# Patient Record
Sex: Female | Born: 2000 | Race: White | Hispanic: No | Marital: Single | Smoking: Never smoker
Health system: Southern US, Community
[De-identification: ages and names within clinical notes are randomized; demographics above are authoritative.]

## PROBLEM LIST (undated history)

## (undated) DIAGNOSIS — F980 Enuresis not due to a substance or known physiological condition: Secondary | ICD-10-CM

## (undated) DIAGNOSIS — A749 Chlamydial infection, unspecified: Secondary | ICD-10-CM

## (undated) DIAGNOSIS — L309 Dermatitis, unspecified: Secondary | ICD-10-CM

## (undated) HISTORY — DX: Chlamydial infection, unspecified: A74.9

## (undated) HISTORY — DX: Dermatitis, unspecified: L30.9

## (undated) HISTORY — PX: OTHER SURGICAL HISTORY: SHX169

## (undated) HISTORY — DX: Enuresis not due to a substance or known physiological condition: F98.0

---
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
## 2019-01-02 DIAGNOSIS — Z20828 Contact with and (suspected) exposure to other viral communicable diseases: Secondary | ICD-10-CM | POA: Diagnosis not present

## 2019-01-03 ENCOUNTER — Other Ambulatory Visit: Payer: Self-pay | Admitting: Primary Care

## 2019-01-22 ENCOUNTER — Other Ambulatory Visit: Payer: Self-pay

## 2019-01-22 ENCOUNTER — Encounter: Payer: Self-pay | Admitting: Primary Care

## 2019-01-22 ENCOUNTER — Ambulatory Visit (INDEPENDENT_AMBULATORY_CARE_PROVIDER_SITE_OTHER): Payer: BC Managed Care – PPO | Admitting: Primary Care

## 2019-01-22 VITALS — BP 120/76 | HR 63 | Temp 98.3°F | Ht 65.5 in | Wt 174.0 lb

## 2019-01-22 DIAGNOSIS — N926 Irregular menstruation, unspecified: Secondary | ICD-10-CM | POA: Diagnosis not present

## 2019-01-22 DIAGNOSIS — F411 Generalized anxiety disorder: Secondary | ICD-10-CM | POA: Diagnosis not present

## 2019-01-22 DIAGNOSIS — Z0000 Encounter for general adult medical examination without abnormal findings: Secondary | ICD-10-CM | POA: Diagnosis not present

## 2019-01-22 NOTE — Patient Instructions (Signed)
Start exercising. You should be getting 150 minutes of moderate intensity exercise weekly.  It's important to improve your diet by reducing consumption of fast food, fried food, processed snack foods, sugary drinks. Increase consumption of fresh vegetables and fruits, whole grains, water.  Ensure you are drinking 64 ounces of water daily.  It was a pleasure to see you today!   Preventive Care 18-39 Years, Female Preventive care refers to lifestyle choices and visits with your health care provider that can promote health and wellness. What does preventive care include?   A yearly physical exam. This is also called an annual well check.  Dental exams once or twice a year.  Routine eye exams. Ask your health care provider how often you should have your eyes checked.  Personal lifestyle choices, including: ? Daily care of your teeth and gums. ? Regular physical activity. ? Eating a healthy diet. ? Avoiding tobacco and drug use. ? Limiting alcohol use. ? Practicing safe sex. ? Taking vitamin and mineral supplements as recommended by your health care provider. What happens during an annual well check? The services and screenings done by your health care provider during your annual well check will depend on your age, overall health, lifestyle risk factors, and family history of disease. Counseling Your health care provider may ask you questions about your:  Alcohol use.  Tobacco use.  Drug use.  Emotional well-being.  Home and relationship well-being.  Sexual activity.  Eating habits.  Work and work environment.  Method of birth control.  Menstrual cycle.  Pregnancy history. Screening You may have the following tests or measurements:  Height, weight, and BMI.  Diabetes screening. This is done by checking your blood sugar (glucose) after you have not eaten for a while (fasting).  Blood pressure.  Lipid and cholesterol levels. These may be checked every 5 years  starting at age 20.  Skin check.  Hepatitis C blood test.  Hepatitis B blood test.  Sexually transmitted disease (STD) testing.  BRCA-related cancer screening. This may be done if you have a family history of breast, ovarian, tubal, or peritoneal cancers.  Pelvic exam and Pap test. This may be done every 3 years starting at age 21. Starting at age 30, this may be done every 5 years if you have a Pap test in combination with an HPV test. Discuss your test results, treatment options, and if necessary, the need for more tests with your health care provider. Vaccines Your health care provider may recommend certain vaccines, such as:  Influenza vaccine. This is recommended every year.  Tetanus, diphtheria, and acellular pertussis (Tdap, Td) vaccine. You may need a Td booster every 10 years.  Varicella vaccine. You may need this if you have not been vaccinated.  HPV vaccine. If you are 26 or younger, you may need three doses over 6 months.  Measles, mumps, and rubella (MMR) vaccine. You may need at least one dose of MMR. You may also need a second dose.  Pneumococcal 13-valent conjugate (PCV13) vaccine. You may need this if you have certain conditions and were not previously vaccinated.  Pneumococcal polysaccharide (PPSV23) vaccine. You may need one or two doses if you smoke cigarettes or if you have certain conditions.  Meningococcal vaccine. One dose is recommended if you are age 19-21 years and a first-year college student living in a residence hall, or if you have one of several medical conditions. You may also need additional booster doses.  Hepatitis A vaccine. You may need   this if you have certain conditions or if you travel or work in places where you may be exposed to hepatitis A.  Hepatitis B vaccine. You may need this if you have certain conditions or if you travel or work in places where you may be exposed to hepatitis B.  Haemophilus influenzae type b (Hib) vaccine. You  may need this if you have certain risk factors. Talk to your health care provider about which screenings and vaccines you need and how often you need them. This information is not intended to replace advice given to you by your health care provider. Make sure you discuss any questions you have with your health care provider. Document Released: 09/17/2001 Document Revised: 03/04/2017 Document Reviewed: 05/23/2015 Elsevier Interactive Patient Education  2019 Elsevier Inc.  

## 2019-01-22 NOTE — Assessment & Plan Note (Signed)
Menstrual cycles improved and are regular on OCP's. Continue same.

## 2019-01-22 NOTE — Progress Notes (Signed)
Subjective:    Patient ID: Briana Flores, female    DOB: [birth year], 18 y.o.   MRN: [id number]  HPI  Briana Flores is a 18 year old female who presents today for complete physical. She is also needing a form completed for school next year.  Immunizations: -Tetanus: Completed in 2012 -Influenza: Due this season  -HPV: Completed series  Diet: She endorses a healthy diet more recently. She is eating vegetables, chicken salad, pickles, lean protein. She is drinking water, green tea, coffee. Little soda. Exercise:  She is not exercising, but is active at work.   Eye exam: Completed several years ago. Dental exam: Completed in 2019   Review of Systems  Constitutional: Negative for unexpected weight change.  HENT: Negative for rhinorrhea.   Eyes: Negative for visual disturbance.  Respiratory: Negative for cough and shortness of breath.   Cardiovascular: Negative for chest pain.  Gastrointestinal: Negative for constipation and diarrhea.  Genitourinary: Negative for difficulty urinating and menstrual problem.  Musculoskeletal: Negative for arthralgias and myalgias.  Skin: Negative for rash.  Allergic/Immunologic: Negative for environmental allergies.  Neurological: Negative for dizziness, numbness and headaches.  Psychiatric/Behavioral:       Feels well managed on fluoxetine       Past Medical History:  Diagnosis Date  . Chlamydia infection    01/19/2018 and 11/11/2018  . Eczema   . Nonorganic enuresis   . Vesicoureteral reflux      Social History   Socioeconomic History  . Marital status: Single    Spouse name: Not on file  . Number of children: Not on file  . Years of education: Not on file  . Highest education level: Not on file  Occupational History  . Not on file  Social Needs  . Financial resource strain: Not on file  . Food insecurity    Worry: Not on file    Inability: Not on file  . Transportation needs    Medical: Not on file    Non-medical: Not on file   Tobacco Use  . Smoking status: Never Smoker  . Smokeless tobacco: Never Used  Substance and Sexual Activity  . Alcohol use: No  . Drug use: No  . Sexual activity: Not on file  Lifestyle  . Physical activity    Days per week: Not on file    Minutes per session: Not on file  . Stress: Not on file  Relationships  . Social Herbalist on phone: Not on file    Gets together: Not on file    Attends religious service: Not on file    Active member of club or organization: Not on file    Attends meetings of clubs or organizations: Not on file    Relationship status: Not on file  . Intimate partner violence    Fear of current or ex partner: Not on file    Emotionally abused: Not on file    Physically abused: Not on file    Forced sexual activity: Not on file  Other Topics Concern  . Not on file  Social History Narrative   Parents marriedD   ad is retired Geneticist, molecular but doing contract work   Mother is Optometrist on disability   1 brother, 1 sister    Past Surgical History:  Procedure Laterality Date  . ureteral reflux surgery  summer 09    Family History  Problem Relation Age of Onset  . Allergies Father   . Hemochromatosis  Mother   . Kidney cancer Paternal Grandmother     No Known Allergies  Current Outpatient Medications on File Prior to Visit  Medication Sig Dispense Refill  . FLUoxetine (PROZAC) 40 MG capsule TAKE 1 CAPSULE BY MOUTH EVERY DAY 90 capsule 1  . SPRINTEC 28 0.25-35 MG-MCG tablet TAKE 1 TABLET BY MOUTH EVERY DAY 84 tablet 0   No current facility-administered medications on file prior to visit.     BP 120/76   Pulse 63   Temp 98.3 F (36.8 C) (Tympanic)   Ht 5' 5.5" (1.664 m)   Wt 174 lb (78.9 kg)   LMP 01/04/2019   SpO2 98%   BMI 28.51 kg/m    Objective:   Physical Exam  Constitutional: She is oriented to person, place, and time. She appears well-nourished.  HENT:  Mouth/Throat: No oropharyngeal exudate.  Eyes: Pupils are  equal, round, and reactive to light. EOM are normal.  Neck: Neck supple. No thyromegaly present.  Cardiovascular: Normal rate and regular rhythm.  Respiratory: Effort normal and breath sounds normal.  GI: Soft. Bowel sounds are normal. There is no abdominal tenderness.  Musculoskeletal: Normal range of motion.  Neurological: She is alert and oriented to person, place, and time.  Skin: Skin is warm and dry.  Psychiatric: She has a normal mood and affect.           Assessment & Plan:

## 2019-01-22 NOTE — Assessment & Plan Note (Signed)
Doing well on fluoxetine 40 mg daily. Denies SI/HI.  Continue same.

## 2019-01-22 NOTE — Assessment & Plan Note (Signed)
Immunizations UTD. Encouraged a healthy diet and regular exercise.  Exam unremarkable. No labs needed. Follow up in 1 year for CPE.

## 2019-02-26 ENCOUNTER — Other Ambulatory Visit: Payer: Self-pay | Admitting: Primary Care

## 2019-02-26 DIAGNOSIS — F411 Generalized anxiety disorder: Secondary | ICD-10-CM

## 2019-03-27 ENCOUNTER — Other Ambulatory Visit: Payer: Self-pay | Admitting: Primary Care

## 2019-04-26 DIAGNOSIS — Z113 Encounter for screening for infections with a predominantly sexual mode of transmission: Secondary | ICD-10-CM | POA: Diagnosis not present

## 2019-06-16 DIAGNOSIS — Z113 Encounter for screening for infections with a predominantly sexual mode of transmission: Secondary | ICD-10-CM | POA: Diagnosis not present

## 2019-06-16 DIAGNOSIS — N760 Acute vaginitis: Secondary | ICD-10-CM | POA: Diagnosis not present

## 2019-07-12 DIAGNOSIS — Z20828 Contact with and (suspected) exposure to other viral communicable diseases: Secondary | ICD-10-CM | POA: Diagnosis not present

## 2019-07-22 DIAGNOSIS — Z20828 Contact with and (suspected) exposure to other viral communicable diseases: Secondary | ICD-10-CM | POA: Diagnosis not present

## 2019-08-03 DIAGNOSIS — Z20828 Contact with and (suspected) exposure to other viral communicable diseases: Secondary | ICD-10-CM | POA: Diagnosis not present

## 2019-08-04 DIAGNOSIS — Z20828 Contact with and (suspected) exposure to other viral communicable diseases: Secondary | ICD-10-CM | POA: Diagnosis not present

## 2019-08-04 DIAGNOSIS — Z2089 Contact with and (suspected) exposure to other communicable diseases: Secondary | ICD-10-CM | POA: Diagnosis not present

## 2019-10-18 DIAGNOSIS — Z23 Encounter for immunization: Secondary | ICD-10-CM | POA: Diagnosis not present

## 2019-11-15 DIAGNOSIS — Z23 Encounter for immunization: Secondary | ICD-10-CM | POA: Diagnosis not present

## 2019-11-22 DIAGNOSIS — F411 Generalized anxiety disorder: Secondary | ICD-10-CM

## 2019-11-22 MED ORDER — FLUOXETINE HCL 40 MG PO CAPS: ORAL_CAPSULE | ORAL | 0 refills | Status: DC

## 2019-12-08 ENCOUNTER — Other Ambulatory Visit: Payer: Self-pay | Admitting: Primary Care

## 2020-01-25 ENCOUNTER — Other Ambulatory Visit: Payer: Self-pay

## 2020-01-25 ENCOUNTER — Ambulatory Visit (INDEPENDENT_AMBULATORY_CARE_PROVIDER_SITE_OTHER): Payer: BC Managed Care – PPO | Admitting: Primary Care

## 2020-01-25 ENCOUNTER — Encounter: Payer: Self-pay | Admitting: Primary Care

## 2020-01-25 VITALS — BP 118/78 | HR 71 | Temp 97.2°F | Ht 65.5 in | Wt 170.5 lb

## 2020-01-25 DIAGNOSIS — N926 Irregular menstruation, unspecified: Secondary | ICD-10-CM | POA: Diagnosis not present

## 2020-01-25 DIAGNOSIS — F411 Generalized anxiety disorder: Secondary | ICD-10-CM | POA: Diagnosis not present

## 2020-01-25 DIAGNOSIS — Z0000 Encounter for general adult medical examination without abnormal findings: Secondary | ICD-10-CM | POA: Diagnosis not present

## 2020-01-25 DIAGNOSIS — Z113 Encounter for screening for infections with a predominantly sexual mode of transmission: Secondary | ICD-10-CM | POA: Diagnosis not present

## 2020-01-25 MED ORDER — HYDROXYZINE HCL 10 MG PO TABS: 10.0000 mg | ORAL_TABLET | Freq: Two times a day (BID) | ORAL | 0 refills | Status: DC | PRN

## 2020-01-25 NOTE — Assessment & Plan Note (Signed)
Asymptomatic. Testing pending.

## 2020-01-25 NOTE — Progress Notes (Signed)
Subjective:    Patient ID: Briana Flores, female    DOB: [birth year], 19 y.o.   MRN: [id number]  HPI  This visit occurred during the SARS-CoV-2 public health emergency.  Safety protocols were in place, including screening questions prior to the visit, additional usage of staff PPE, and extensive cleaning of exam room while observing appropriate contact time as indicated for disinfecting solutions.   Briana Flores is a 19 year old female who presents today for complete physical. She is also requesting STD testing.   She's overall doing well, but has noticed occasional panic attacks or inability to handle stressful situations. She feels as though fluoxetine is helping with overall symptoms, but she would like something to take as needed.   Immunizations: -Tetanus: Completed in 2012 -Influenza: Due this season  -Covid-19: Completed series  -HPV: Completed series  Diet: She endorses a healthy diet.  Exercise: She is active at work, walks most of her working day. 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   Eye exam: Virtual eye exam recently  Dental exam: No recent exam due to Covid-19.  BP Readings from Last 3 Encounters:  01/25/20 118/78  01/22/19 120/76  12/09/18 132/85     Review of Systems  Constitutional: Negative for unexpected weight change.  HENT: Negative for rhinorrhea.   Respiratory: Negative for shortness of breath.   Cardiovascular: Negative for chest pain.  Gastrointestinal: Negative for constipation and diarrhea.  Genitourinary: Negative for difficulty urinating and menstrual problem.  Musculoskeletal: Negative for arthralgias and myalgias.  Skin: Negative for rash.  Allergic/Immunologic: Negative for environmental allergies.  Neurological: Negative for dizziness and headaches.  Psychiatric/Behavioral:       See HPI       Past Medical History:  Diagnosis Date  . Chlamydia infection    01/19/2018 and 11/11/2018  . Eczema   . Nonorganic enuresis   .  Vesicoureteral reflux      Social History   Socioeconomic History  . Marital status: Single    Spouse name: Not on file  . Number of children: Not on file  . Years of education: Not on file  . Highest education level: Not on file  Occupational History  . Not on file  Tobacco Use  . Smoking status: Never Smoker  . Smokeless tobacco: Never Used  Substance and Sexual Activity  . Alcohol use: No  . Drug use: No  . Sexual activity: Not on file  Other Topics Concern  . Not on file  Social History Narrative   Parents marriedD   ad is retired Radiation protection practitioner but doing contract work   Mother is Airline pilot on disability   1 brother, 1 sister   Social Determinants of Corporate investment banker Strain:   . Difficulty of Paying Living Expenses:   Food Insecurity:   . Worried About Programme researcher, broadcasting/film/video in the Last Year:   . Barista in the Last Year:   Transportation Needs:   . Freight forwarder (Medical):   Marland Kitchen Lack of Transportation (Non-Medical):   Physical Activity:   . Days of Exercise per Week:   . Minutes of Exercise per Session:   Stress:   . Feeling of Stress :   Social Connections:   . Frequency of Communication with Friends and Family:   . Frequency of Social Gatherings with Friends and Family:   . Attends Religious Services:   . Active Member of Clubs or Organizations:   .  Attends Archivist Meetings:   Marland Kitchen Marital Status:   Intimate Partner Violence:   . Fear of Current or Ex-Partner:   . Emotionally Abused:   Marland Kitchen Physically Abused:   . Sexually Abused:     Past Surgical History:  Procedure Laterality Date  . ureteral reflux surgery  summer 09    Family History  Problem Relation Age of Onset  . Allergies Father   . Hemochromatosis Mother   . Kidney cancer Paternal Grandmother     No Known Allergies  Current Outpatient Medications on File Prior to Visit  Medication Sig Dispense Refill  . FLUoxetine (PROZAC) 40 MG capsule TAKE 1  CAPSULE BY MOUTH EVERY DAY 90 capsule 0  . SPRINTEC 28 0.25-35 MG-MCG tablet TAKE 1 TABLET BY MOUTH EVERY DAY 84 tablet 0   No current facility-administered medications on file prior to visit.    BP 118/78   Pulse 71   Temp (!) 97.2 F (36.2 C) (Temporal)   Ht 5' 5.5" (1.664 m)   Wt 170 lb 8 oz (77.3 kg)   LMP 01/11/2020   SpO2 98%   BMI 27.94 kg/m    Objective:   Physical Exam  Constitutional: She is oriented to person, place, and time.  HENT:  Right Ear: Tympanic membrane and ear canal normal.  Left Ear: Tympanic membrane and ear canal normal.  Eyes: Pupils are equal, round, and reactive to light.  Cardiovascular: Normal rate and regular rhythm.  Respiratory: Effort normal and breath sounds normal.  GI: Soft. Bowel sounds are normal. There is no abdominal tenderness.  Musculoskeletal:        General: Normal range of motion.     Cervical back: Neck supple.  Neurological: She is alert and oriented to person, place, and time. No cranial nerve deficit.  Reflex Scores:      Patellar reflexes are 2+ on the right side and 2+ on the left side. Skin: Skin is warm and dry.  Psychiatric: Mood normal.           Assessment & Plan:

## 2020-01-25 NOTE — Assessment & Plan Note (Signed)
Immunizations UTD. Pap smear due next year. Encouraged a healthy diet, regular exercise. Exam today unremarkable. Labs pending.

## 2020-01-25 NOTE — Assessment & Plan Note (Signed)
Well controlled on OCP's, continue same. Pap smear due next year.

## 2020-01-25 NOTE — Assessment & Plan Note (Signed)
Overall doing well on fluoxetine, does have breakthrough anxiety. Discussed a couple of options.  We will start with low dose PRN hydroxyzine. If this isn't helpful then we likely need to switch from fluoxetine to Lexapro/Celexa as this is more effective for anxiety treatment.  She agrees and will update, drowsiness precautions provided.

## 2020-01-25 NOTE — Patient Instructions (Signed)
You may take hydroxyzine 10 mg up to twice daily if needed for breakthrough anxiety. Please update me in a few weeks as discussed.  Continue taking fluoxetine (Prozac).   Continue exercising. You should be getting 150 minutes of moderate intensity exercise weekly.  It's important to improve your diet by reducing consumption of fast food, fried food, processed snack foods, sugary drinks. Increase consumption of fresh vegetables and fruits, whole grains, water.  Ensure you are drinking 64 ounces of water daily.  Stop by the lab prior to leaving today. I will notify you of your results once received.   It was a pleasure to see you today!   Preventive Care 20-68 Years Old, Female Preventive care refers to lifestyle choices and visits with your health care provider that can promote health and wellness. At this stage in your life, you may start seeing a primary care physician instead of a pediatrician. Your health care is now your responsibility. Preventive care for young adults includes:  A yearly physical exam. This is also called an annual wellness visit.  Regular dental and eye exams.  Immunizations.  Screening for certain conditions.  Healthy lifestyle choices, such as diet and exercise. What can I expect for my preventive care visit? Physical exam Your health care provider may check:  Height and weight. These may be used to calculate body mass index (BMI), which is a measurement that tells if you are at a healthy weight.  Heart rate and blood pressure.  Body temperature. Counseling Your health care provider may ask you questions about:  Past medical problems and family medical history.  Alcohol, tobacco, and drug use.  Home and relationship well-being.  Access to firearms.  Emotional well-being.  Diet, exercise, and sleep habits.  Sexual activity and sexual health.  Method of birth control.  Menstrual cycle.  Pregnancy history. What immunizations do I  need?  Influenza (flu) vaccine  This is recommended every year. Tetanus, diphtheria, and pertussis (Tdap) vaccine  You may need a Td booster every 10 years. Varicella (chickenpox) vaccine  You may need this vaccine if you have not already been vaccinated. Human papillomavirus (HPV) vaccine  If recommended by your health care provider, you may need three doses over 6 months. Measles, mumps, and rubella (MMR) vaccine  You may need at least one dose of MMR. You may also need a second dose. Meningococcal conjugate (MenACWY) vaccine  One dose is recommended if you are 20-17 years old and a Market researcher living in a residence hall, or if you have one of several medical conditions. You may also need additional booster doses. Pneumococcal conjugate (PCV13) vaccine  You may need this if you have certain conditions and were not previously vaccinated. Pneumococcal polysaccharide (PPSV23) vaccine  You may need one or two doses if you smoke cigarettes or if you have certain conditions. Hepatitis A vaccine  You may need this if you have certain conditions or if you travel or work in places where you may be exposed to hepatitis A. Hepatitis B vaccine  You may need this if you have certain conditions or if you travel or work in places where you may be exposed to hepatitis B. Haemophilus influenzae type b (Hib) vaccine  You may need this if you have certain risk factors. You may receive vaccines as individual doses or as more than one vaccine together in one shot (combination vaccines). Talk with your health care provider about the risks and benefits of combination vaccines. What tests  do I need? Blood tests  Lipid and cholesterol levels. These may be checked every 5 years starting at age 20.  Hepatitis C test.  Hepatitis B test. Screening  Pelvic exam and Pap test. This may be done every 3 years starting at age 20.  Sexually transmitted disease (STD) testing, if you are  at risk.  BRCA-related cancer screening. This may be done if you have a family history of breast, ovarian, tubal, or peritoneal cancers. Other tests  Tuberculosis skin test.  Vision and hearing tests.  Skin exam.  Breast exam. Follow these instructions at home: Eating and drinking   Eat a diet that includes fresh fruits and vegetables, whole grains, lean protein, and low-fat dairy products.  Drink enough fluid to keep your urine pale yellow.  Do not drink alcohol if: ? Your health care provider tells you not to drink. ? You are pregnant, may be pregnant, or are planning to become pregnant. ? You are under the legal drinking age. In the [place] the legal drinking age is 65.  If you drink alcohol: ? Limit how much you have to 0-1 drink a day. ? Be aware of how much alcohol is in your drink. In the [place] one drink equals one 12 oz bottle of beer (355 mL), one 5 oz glass of wine (148 mL), or one 1 oz glass of hard liquor (44 mL). Lifestyle  Take daily care of your teeth and gums.  Stay active. Exercise at least 30 minutes 5 or more days of the week.  Do not use any products that contain nicotine or tobacco, such as cigarettes, e-cigarettes, and chewing tobacco. If you need help quitting, ask your health care provider.  Do not use drugs.  If you are sexually active, practice safe sex. Use a condom or other form of birth control (contraception) in order to prevent pregnancy and STIs (sexually transmitted infections). If you plan to become pregnant, see your health care provider for a pre-conception visit.  Find healthy ways to cope with stress, such as: ? Meditation, yoga, or listening to music. ? Journaling. ? Talking to a trusted person. ? Spending time with friends and family. Safety  Always wear your seat belt while driving or riding in a vehicle.  Do not drive if you have been drinking alcohol. Do not ride with someone who has been drinking.  Do not drive when you are  tired or distracted. Do not text while driving.  Wear a helmet and other protective equipment during sports activities.  If you have firearms in your house, make sure you follow all gun safety procedures.  Seek help if you have been bullied, physically abused, or sexually abused.  Use the Internet responsibly to avoid dangers such as online bullying and online sex predators. What's next?  Go to your health care provider once a year for a well check visit.  Ask your health care provider how often you should have your eyes and teeth checked.  Stay up to date on all vaccines. This information is not intended to replace advice given to you by your health care provider. Make sure you discuss any questions you have with your health care provider. Document Revised: 07/16/2018 Document Reviewed: 07/16/2018 Elsevier Patient Education  2020 [employer].

## 2020-01-26 ENCOUNTER — Telehealth: Payer: Self-pay | Admitting: *Deleted

## 2020-01-26 NOTE — Telephone Encounter (Signed)
Patient left a voicemail requesting a call back about the test results that she just got. Patient stated that she would like to discuss the results.

## 2020-01-27 NOTE — Telephone Encounter (Signed)
See my chart message

## 2020-01-28 DIAGNOSIS — Z111 Encounter for screening for respiratory tuberculosis: Secondary | ICD-10-CM | POA: Diagnosis not present

## 2020-01-30 LAB — C. TRACHOMATIS/N. GONORRHOEAE RNA
C. trachomatis RNA, TMA: NOT DETECTED
N. gonorrhoeae RNA, TMA: NOT DETECTED

## 2020-01-30 LAB — HEPATITIS C ANTIBODY
Hepatitis C Ab: NONREACTIVE
SIGNAL TO CUT-OFF: 0.02 (ref <1.00)

## 2020-01-30 LAB — TRICHOMONAS VAGINALIS, PROBE AMP: Trichomonas vaginalis RNA: NOT DETECTED

## 2020-01-30 LAB — GC/CHLAMYDIA PROBE, AMP (THROAT)
Chlamydia trachomatis RNA: NOT DETECTED
Neisseria gonorrhoeae RNA: NOT DETECTED

## 2020-01-30 LAB — HIV ANTIBODY (ROUTINE TESTING W REFLEX): HIV 1&2 Ab, 4th Generation: NONREACTIVE

## 2020-01-30 LAB — HSV 1/2 AB (IGM), IFA W/RFLX TITER
HSV 1 IgM Screen: NEGATIVE
HSV 2 IgM Screen: NEGATIVE

## 2020-01-30 LAB — HSV(HERPES SIMPLEX VRS) I + II AB-IGG
HAV 1 IGG,TYPE SPECIFIC AB: 3.62 index — ABNORMAL HIGH
HSV 2 IGG,TYPE SPECIFIC AB: <0.9 index

## 2020-01-30 LAB — RPR: RPR Ser Ql: NONREACTIVE

## 2020-03-03 ENCOUNTER — Other Ambulatory Visit: Payer: Self-pay | Admitting: Primary Care

## 2020-04-26 DIAGNOSIS — Z113 Encounter for screening for infections with a predominantly sexual mode of transmission: Secondary | ICD-10-CM | POA: Diagnosis not present

## 2020-04-26 DIAGNOSIS — Z114 Encounter for screening for human immunodeficiency virus [HIV]: Secondary | ICD-10-CM | POA: Diagnosis not present

## 2020-05-25 ENCOUNTER — Other Ambulatory Visit: Payer: Self-pay | Admitting: Primary Care

## 2020-05-25 DIAGNOSIS — F411 Generalized anxiety disorder: Secondary | ICD-10-CM

## 2020-07-27 DIAGNOSIS — Z20828 Contact with and (suspected) exposure to other viral communicable diseases: Secondary | ICD-10-CM | POA: Diagnosis not present

## 2020-08-12 ENCOUNTER — Other Ambulatory Visit: Payer: Self-pay | Admitting: Primary Care

## 2020-08-22 NOTE — Telephone Encounter (Signed)
Joellen, can you add her to the schedule for this Thursday 01/20 at 12:20 or 12:40 slot?  She requested 1230

## 2020-08-22 NOTE — Telephone Encounter (Signed)
Patient left a voicemail stating that she sent a mychart message previously to [name] NP. Patient stated that she would like for Jae Dire to review the message and respond to her message. Patient stated that she is concerned about her mental health and the Prozac that she is taking. Tried to call patient back and got her voicemail. Left a message for patient to call the office back to get more information.

## 2020-08-24 ENCOUNTER — Telehealth (INDEPENDENT_AMBULATORY_CARE_PROVIDER_SITE_OTHER): Payer: BC Managed Care – PPO | Admitting: Primary Care

## 2020-08-24 ENCOUNTER — Encounter: Payer: Self-pay | Admitting: Primary Care

## 2020-08-24 VITALS — Ht 65.0 in | Wt 175.0 lb

## 2020-08-24 DIAGNOSIS — F411 Generalized anxiety disorder: Secondary | ICD-10-CM

## 2020-08-24 DIAGNOSIS — F321 Major depressive disorder, single episode, moderate: Secondary | ICD-10-CM | POA: Diagnosis not present

## 2020-08-24 MED ORDER — SERTRALINE HCL 50 MG PO TABS: 50.0000 mg | ORAL_TABLET | Freq: Every day | ORAL | 1 refills | Status: DC

## 2020-08-24 MED ORDER — HYDROXYZINE PAMOATE 25 MG PO CAPS: 25.0000 mg | ORAL_CAPSULE | Freq: Two times a day (BID) | ORAL | 0 refills | Status: DC | PRN

## 2020-08-24 NOTE — Assessment & Plan Note (Signed)
Deteriorated.  Also with symptoms of depression. Discussed options for treatment, she kindly declines therapy as it was ineffective in the past. We discussed switching medications from fluoxetine 40 mg to sertraline 50 mg, she agrees.  We will also increase the dose of her hydroxyzine to 25 mg.  Discussed potential side effects as she transitions from 1 medication to the other.  She will set up a follow-up visit for 1 month.

## 2020-08-24 NOTE — Patient Instructions (Signed)
Stop taking fluoxetine 40 mg for anxiety and depression. Start taking sertraline 50 mg for anxiety and depression.  We increase the dose of your hydroxyzine to 25 mg, you may take 1 tablet twice daily as needed for anxiety.  This may cause drowsiness.  It was a pleasure to see you today! [name], NP-C

## 2020-08-24 NOTE — Progress Notes (Signed)
Subjective:    Patient ID: Briana Flores, female    DOB: July 25, 2001, 19 y.o.   MRN: [id number]  HPI  Virtual Visit via Video Note  I connected with Briana Flores on 08/24/20 at  7:20 AM EST by a video enabled telemedicine application and verified that I am speaking with the correct person using two identifiers.  Location: Patient: Home Provider: Office Participants: Patient, myself   I discussed the limitations of evaluation and management by telemedicine and the availability of in person appointments. The patient expressed understanding and agreed to proceed.  History of Present Illness:  Briana Flores is a 20 year old female with a history of GAD who presents today to discuss depression and anxiety.  She is currently managed on fluoxetine 40 mg for which she's been taking for quite some time.   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 She's feeling overwhelmed with her course work, has a large paper due in a few months.   She also feels like her body has felt differently. She's not sleeping well, has tried taking Melatonin, using a fan in her room, changed her bedding without much improvement. Also with increased headaches, that are more intense just prior to and during menses. Headaches will last for days.   She had Covid-19 over Christmas 2021, could not see her family, lots of stress with her family regarding this. During her first week of class (remote on zoom), she was home alone, her roommates had an electrician coming so she had to log off her class, missed a lot of vital information, this caused panic and frustrated. Later that day she was driving to work, felt a panic attack coming, had to pull over to the side of the road.  No panic attack  since then, but has felt that she could have had one several times. Also with lack of motivation to do things, difficulty doing things, finding little joy, not wanting to socialize.  She does not feel as though the hydroxyzine 10 mg is helping with panic attacks.  She is interested in trying another medicine daily for her anxiety and depression.   Also, the brand of her birth control was changed from Sprintec to Ortho-Cyclen by her pharmacy.  The dose however was not changed.   Observations/Objective:  Alert and oriented. Appears well, not sickly. No distress. Speaking in complete sentences.   Assessment and Plan:  Deteriorated.  Also with symptoms of depression. Discussed options for treatment, she kindly declines therapy as it was ineffective in the past. We discussed switching medications from fluoxetine 40 mg to sertraline 50 mg, she agrees.  We will also increase the dose of her hydroxyzine to 25 mg.  Discussed potential side effects as she transitions from 1 medication to the other.  She will set up a follow-up visit for 1 month.  Follow Up Instructions:  Stop taking fluoxetine 40 mg for anxiety and depression. Start taking sertraline 50 mg for anxiety and depression.  We increase the dose of your hydroxyzine to 25 mg, you may take 1 tablet twice daily as needed for anxiety.  This may cause drowsiness.  It was a pleasure to see you today! [name], NP-C    I discussed the assessment  and treatment plan with the patient. The patient was provided an opportunity to ask questions and all were answered. The patient agreed with the plan and demonstrated an understanding of the instructions.   The patient was advised to call back or seek an in-person evaluation if the symptoms worsen or if the condition fails to improve as anticipated.    [name], NP    Review of Systems  Neurological: Positive for headaches.  Psychiatric/Behavioral: Positive for sleep  disturbance. The patient is nervous/anxious.        See HPI       Past Medical History:  Diagnosis Date  . Chlamydia infection    01/19/2018 and 11/11/2018  . Eczema   . Nonorganic enuresis   . Vesicoureteral reflux      Social History   Socioeconomic History  . Marital status: Single    Spouse name: Not on file  . Number of children: Not on file  . Years of education: Not on file  . Highest education level: Not on file  Occupational History  . Not on file  Tobacco Use  . Smoking status: Never Smoker  . Smokeless tobacco: Never Used  Substance and Sexual Activity  . Alcohol use: No  . Drug use: No  . Sexual activity: Not on file  Other Topics Concern  . Not on file  Social History Narrative   Parents marriedD   ad is retired Radiation protection practitioner but doing Community education officer work   Mother is Airline pilot on disability   1 brother, 1 sister   Social Determinants of Corporate investment banker Strain: Not on [employer] Insecurity: Not on file  Transportation Needs: Not on file  Physical Activity: Not on file  Stress: Not on file  Social Connections: Not on file  Intimate Partner Violence: Not on file    Past Surgical History:  Procedure Laterality Date  . ureteral reflux surgery  summer 09    Family History  Problem Relation Age of Onset  . Allergies Father   . Hemochromatosis Mother   . Kidney cancer Paternal Grandmother     No Known Allergies  Current Outpatient Medications on File Prior to Visit  Medication Sig Dispense Refill  . norgestimate-ethinyl estradiol (ORTHO-CYCLEN) 0.25-35 MG-MCG tablet TAKE 1 TABLET BY MOUTH EVERY DAY 84 tablet 1   No current facility-administered medications on file prior to visit.    Ht 5\' 5"  (1.651 m)   Wt 175 lb (79.4 kg)   BMI 29.12 kg/m    Objective:   Physical Exam Constitutional:      General: She is not in acute distress. Pulmonary:     Effort: Pulmonary effort is normal.  Neurological:     Mental Status: She is alert  and oriented to person, place, and time.  Psychiatric:        Mood and Affect: Mood normal.            Assessment & Plan:

## 2020-08-24 NOTE — Assessment & Plan Note (Signed)
Deteriorated.  Also with symptoms of depression. Discussed options for treatment, she kindly declines therapy as it was ineffective in the past. We discussed switching medications from fluoxetine 40 mg to sertraline 50 mg, she agrees.  We will also increase the dose of her hydroxyzine to 25 mg.  Discussed potential side effects as she transitions from 1 medication to the other.  She will set up a follow-up visit for 1 month.  

## 2020-09-01 ENCOUNTER — Other Ambulatory Visit: Payer: Self-pay | Admitting: Primary Care

## 2020-09-01 DIAGNOSIS — F411 Generalized anxiety disorder: Secondary | ICD-10-CM

## 2020-09-01 NOTE — Telephone Encounter (Signed)
30 day called in on 1/20 ok to change to 90 day?

## 2020-09-04 NOTE — Telephone Encounter (Signed)
No, I wanted her to try this and update me.  Is she out? Is this helping? How often is she taking?

## 2020-09-04 NOTE — Telephone Encounter (Signed)
Pt called back in and stated that so far so good on the medication. She is taking it everyday as prescribed , she has seen improvement but not sure if its the medication help or just in general.

## 2020-09-04 NOTE — Telephone Encounter (Signed)
The hydroxyzine is an as needed medication, the sertraline (Zoloft) is a daily medicine. Make sure she understands the hydroxyzine is only to be used if needed for acute panic attacks, Zoloft is supposed to be used daily

## 2020-09-04 NOTE — Telephone Encounter (Signed)
Left message to return call to our office.  

## 2020-09-13 NOTE — Telephone Encounter (Signed)
Spoke with patient and she verified she is taking the hydroxyzine as needed and the zoloft daily.

## 2020-09-13 NOTE — Telephone Encounter (Signed)
LMTCB

## 2020-09-16 ENCOUNTER — Other Ambulatory Visit: Payer: Self-pay | Admitting: Primary Care

## 2020-09-16 DIAGNOSIS — F411 Generalized anxiety disorder: Secondary | ICD-10-CM

## 2020-09-16 DIAGNOSIS — F321 Major depressive disorder, single episode, moderate: Secondary | ICD-10-CM

## 2020-09-23 ENCOUNTER — Other Ambulatory Visit: Payer: Self-pay | Admitting: Primary Care

## 2020-09-23 DIAGNOSIS — F411 Generalized anxiety disorder: Secondary | ICD-10-CM

## 2020-10-11 ENCOUNTER — Other Ambulatory Visit: Payer: Self-pay | Admitting: Primary Care

## 2020-10-11 DIAGNOSIS — F411 Generalized anxiety disorder: Secondary | ICD-10-CM

## 2020-10-11 NOTE — Telephone Encounter (Signed)
Does she actually need a refill of hydroxyzine or was this an autofill? How Is she doing on the Zoloft daily for anxiety?

## 2020-10-12 NOTE — Telephone Encounter (Signed)
Patient called back did not need refill wanted to let you know that she doing much better on the zoloft.

## 2020-10-12 NOTE — Telephone Encounter (Signed)
Left message to return call to our office.  

## 2020-11-29 DIAGNOSIS — Z114 Encounter for screening for human immunodeficiency virus [HIV]: Secondary | ICD-10-CM | POA: Diagnosis not present

## 2020-11-29 DIAGNOSIS — Z113 Encounter for screening for infections with a predominantly sexual mode of transmission: Secondary | ICD-10-CM | POA: Diagnosis not present

## 2020-12-21 DIAGNOSIS — Z23 Encounter for immunization: Secondary | ICD-10-CM | POA: Diagnosis not present

## 2021-01-30 ENCOUNTER — Other Ambulatory Visit: Payer: Self-pay | Admitting: Primary Care

## 2021-01-30 DIAGNOSIS — N926 Irregular menstruation, unspecified: Secondary | ICD-10-CM

## 2021-02-04 ENCOUNTER — Other Ambulatory Visit: Payer: Self-pay

## 2021-02-04 DIAGNOSIS — N926 Irregular menstruation, unspecified: Secondary | ICD-10-CM

## 2021-02-22 DIAGNOSIS — N926 Irregular menstruation, unspecified: Secondary | ICD-10-CM

## 2021-02-22 MED ORDER — NORGESTIMATE-ETH ESTRADIOL 0.25-35 MG-MCG PO TABS: 1.0000 | ORAL_TABLET | Freq: Every day | ORAL | 0 refills | Status: DC

## 2021-02-22 NOTE — Telephone Encounter (Signed)
Ok to do refill.

## 2021-02-23 ENCOUNTER — Ambulatory Visit: Payer: BC Managed Care – PPO | Admitting: Primary Care

## 2021-02-24 ENCOUNTER — Other Ambulatory Visit: Payer: Self-pay | Admitting: Primary Care

## 2021-02-24 DIAGNOSIS — N926 Irregular menstruation, unspecified: Secondary | ICD-10-CM

## 2021-03-14 ENCOUNTER — Other Ambulatory Visit: Payer: Self-pay

## 2021-03-14 ENCOUNTER — Other Ambulatory Visit (HOSPITAL_COMMUNITY)
Admission: RE | Admit: 2021-03-14 | Discharge: 2021-03-14 | Disposition: A | Discharge status: Home / Self Care | Payer: BC Managed Care – PPO | Source: Ambulatory Visit | Attending: Primary Care | Admitting: Primary Care

## 2021-03-14 ENCOUNTER — Encounter: Payer: Self-pay | Admitting: Primary Care

## 2021-03-14 ENCOUNTER — Ambulatory Visit (INDEPENDENT_AMBULATORY_CARE_PROVIDER_SITE_OTHER): Payer: BC Managed Care – PPO | Admitting: Primary Care

## 2021-03-14 VITALS — BP 110/68 | HR 84 | Temp 97.6°F | Ht 65.0 in | Wt 200.0 lb

## 2021-03-14 DIAGNOSIS — Z124 Encounter for screening for malignant neoplasm of cervix: Secondary | ICD-10-CM | POA: Insufficient documentation

## 2021-03-14 DIAGNOSIS — Z0000 Encounter for general adult medical examination without abnormal findings: Secondary | ICD-10-CM | POA: Diagnosis not present

## 2021-03-14 DIAGNOSIS — F321 Major depressive disorder, single episode, moderate: Secondary | ICD-10-CM | POA: Diagnosis not present

## 2021-03-14 DIAGNOSIS — F411 Generalized anxiety disorder: Secondary | ICD-10-CM | POA: Diagnosis not present

## 2021-03-14 DIAGNOSIS — Z23 Encounter for immunization: Secondary | ICD-10-CM

## 2021-03-14 DIAGNOSIS — N926 Irregular menstruation, unspecified: Secondary | ICD-10-CM | POA: Diagnosis not present

## 2021-03-14 MED ORDER — NORGESTIMATE-ETH ESTRADIOL 0.25-35 MG-MCG PO TABS: 1.0000 | ORAL_TABLET | Freq: Every day | ORAL | 3 refills | Status: DC

## 2021-03-14 MED ORDER — SERTRALINE HCL 50 MG PO TABS: ORAL_TABLET | ORAL | 3 refills | Status: AC

## 2021-03-14 NOTE — Assessment & Plan Note (Signed)
Doing well overall, continue sertraline 50 mg. Refills provided.

## 2021-03-14 NOTE — Assessment & Plan Note (Signed)
Improved and doing well on daily sertraline 50 mg, using hydroxyzine 25 mg PRN. Refills provided.

## 2021-03-14 NOTE — Progress Notes (Signed)
Subjective:    Patient ID: Briana Flores, female    DOB: 05-24-2001, 20 y.o.   MRN: [id number]  HPI  Briana Flores is a very pleasant 20 y.o. female who presents today for complete physical and follow up of chronic conditions.  Immunizations: -Tetanus: 2012, due today -Covid-19:  -HPV: Completed series  Diet: Fair diet.  Exercise: No regular exercise.  Eye exam: Completes annually  Dental exam: Completes semi-annually   Pap Smear: Due today  BP Readings from Last 3 Encounters:  03/14/21 110/68  01/25/20 118/78  01/22/19 120/76       Review of Systems  Constitutional:  Negative for unexpected weight change.  HENT:  Negative for rhinorrhea.   Respiratory:  Negative for cough and shortness of breath.   Cardiovascular:  Negative for chest pain.  Gastrointestinal:  Negative for constipation and diarrhea.  Genitourinary:  Negative for difficulty urinating and menstrual problem.  Musculoskeletal:  Negative for arthralgias and myalgias.  Skin:  Negative for rash.  Allergic/Immunologic: Negative for environmental allergies.  Neurological:  Negative for dizziness and headaches.  Psychiatric/Behavioral:  The patient is not nervous/anxious.         Past Medical History:  Diagnosis Date   Chlamydia infection    01/19/2018 and 11/11/2018   Eczema    Nonorganic enuresis    Vesicoureteral reflux     Social History   Socioeconomic History   Marital status: Single    Spouse name: Not on file   Number of children: Not on file   Years of education: Not on file   Highest education level: Not on file  Occupational History   Not on file  Tobacco Use   Smoking status: Never   Smokeless tobacco: Never  Substance and Sexual Activity   Alcohol use: No   Drug use: No   Sexual activity: Not on file  Other Topics Concern   Not on file  Social History Narrative   Parents marriedD   ad is retired Radiation protection practitioner but doing Community education officer work   Mother is Airline pilot on  disability   1 brother, 1 sister   Social Determinants of Corporate investment banker Strain: Not on file  Food Insecurity: Not on file  Transportation Needs: Not on file  Physical Activity: Not on file  Stress: Not on file  Social Connections: Not on file  Intimate Partner Violence: Not on file    Past Surgical History:  Procedure Laterality Date   ureteral reflux surgery  summer 09    Family History  Problem Relation Age of Onset   Allergies Father    Hemochromatosis Mother    Kidney cancer Paternal Grandmother     No Known Allergies  Current Outpatient Medications on File Prior to Visit  Medication Sig Dispense Refill   hydrOXYzine (VISTARIL) 25 MG capsule TAKE 1 CAPSULE BY MOUTH 2 TIMES DAILY AS NEEDED FOR ANXIETY. 60 capsule 0   No current facility-administered medications on file prior to visit.    BP 110/68   Pulse 84   Temp 97.6 F (36.4 C) (Temporal)   Ht 5\' 5"  (1.651 m)   Wt 200 lb (90.7 kg)   LMP 03/01/2021 (Approximate)   SpO2 98%   BMI 33.28 kg/m  Objective:   Physical Exam Exam conducted with a chaperone present.  HENT:     Right Ear: Tympanic membrane and ear canal normal.     Left Ear: Tympanic membrane and ear canal normal.  Nose: Nose normal.  Eyes:     Conjunctiva/sclera: Conjunctivae normal.     Pupils: Pupils are equal, round, and reactive to light.  Neck:     Thyroid: No thyromegaly.  Cardiovascular:     Rate and Rhythm: Normal rate and regular rhythm.     Heart sounds: No murmur heard. Pulmonary:     Effort: Pulmonary effort is normal.     Breath sounds: Normal breath sounds. No rales.  Abdominal:     General: Bowel sounds are normal.     Palpations: Abdomen is soft.     Tenderness: There is no abdominal tenderness.  Genitourinary:    Labia:        Right: No tenderness or lesion.        Left: No tenderness or lesion.      Vagina: Normal.     Cervix: Normal.     Uterus: Normal.      Adnexa: Right adnexa normal and left  adnexa normal.  Musculoskeletal:        General: Normal range of motion.     Cervical back: Neck supple.  Lymphadenopathy:     Cervical: No cervical adenopathy.  Skin:    General: Skin is warm and dry.     Findings: No rash.  Neurological:     Mental Status: She is alert and oriented to person, place, and time.     Cranial Nerves: No cranial nerve deficit.     Deep Tendon Reflexes: Reflexes are normal and symmetric.  Psychiatric:        Mood and Affect: Mood normal.          Assessment & Plan:      This visit occurred during the SARS-CoV-2 public health emergency.  Safety protocols were in place, including screening questions prior to the visit, additional usage of staff PPE, and extensive cleaning of exam room while observing appropriate contact time as indicated for disinfecting solutions.

## 2021-03-14 NOTE — Assessment & Plan Note (Signed)
Regular menses since on OCP's.  Continue same. Refills provided.  Pap smear due today, completed today.

## 2021-03-14 NOTE — Patient Instructions (Addendum)
It was a pleasure to see you today!  Preventive Care 61-21 Years Old, Female Preventive care refers to lifestyle choices and visits with your health care provider that can promote health and wellness. This includes: A yearly physical exam. This is also called an annual wellness visit. Regular dental and eye exams. Immunizations. Screening for certain conditions. Healthy lifestyle choices, such as: Eating a healthy diet. Getting regular exercise. Not using drugs or products that contain nicotine and tobacco. Limiting alcohol use. What can I expect for my preventive care visit? Physical exam Your health care provider may check your: Height and weight. These may be used to calculate your BMI (body mass index). BMI is a measurement that tells if you are at a healthy weight. Heart rate and blood pressure. Body temperature. Skin for abnormal spots. Counseling Your health care provider may ask you questions about your: Past medical problems. Family's medical history. Alcohol, tobacco, and drug use. Emotional well-being. Home life and relationship well-being. Sexual activity. Diet, exercise, and sleep habits. Work and work Statistician. Access to firearms. Method of birth control. Menstrual cycle. Pregnancy history. What immunizations do I need?  Vaccines are usually given at various ages, according to a schedule. Your health care provider will recommend vaccines for you based on your age, medicalhistory, and lifestyle or other factors, such as travel or where you work. What tests do I need?  Blood tests Lipid and cholesterol levels. These may be checked every 5 years starting at age 69. Hepatitis C test. Hepatitis B test. Screening Diabetes screening. This is done by checking your blood sugar (glucose) after you have not eaten for a while (fasting). STD (sexually transmitted disease) testing, if you are at risk. BRCA-related cancer screening. This may be done if you have a  family history of breast, ovarian, tubal, or peritoneal cancers. Pelvic exam and Pap test. This may be done every 3 years starting at age 29. Starting at age 27, this may be done every 5 years if you have a Pap test in combination with an HPV test. Talk with your health care provider about your test results, treatment options,and if necessary, the need for more tests. Follow these instructions at home: Eating and drinking  Eat a healthy diet that includes fresh fruits and vegetables, whole grains, lean protein, and low-fat dairy products. Take vitamin and mineral supplements as recommended by your health care provider. Do not drink alcohol if: Your health care provider tells you not to drink. You are pregnant, may be pregnant, or are planning to become pregnant. If you drink alcohol: Limit how much you have to 0-1 drink a day. Be aware of how much alcohol is in your drink. In the [place] one drink equals one 12 oz bottle of beer (355 mL), one 5 oz glass of wine (148 mL), or one 1 oz glass of hard liquor (44 mL).  Lifestyle Take daily care of your teeth and gums. Brush your teeth every morning and night with fluoride toothpaste. Floss one time each day. Stay active. Exercise for at least 30 minutes 5 or more days each week. Do not use any products that contain nicotine or tobacco, such as cigarettes, e-cigarettes, and chewing tobacco. If you need help quitting, ask your health care provider. Do not use drugs. If you are sexually active, practice safe sex. Use a condom or other form of protection to prevent STIs (sexually transmitted infections). If you do not wish to become pregnant, use a form of birth control. If  you plan to become pregnant, see your health care provider for a prepregnancy visit. Find healthy ways to cope with stress, such as: Meditation, yoga, or listening to music. Journaling. Talking to a trusted person. Spending time with friends and family. Safety Always wear your  seat belt while driving or riding in a vehicle. Do not drive: If you have been drinking alcohol. Do not ride with someone who has been drinking. When you are tired or distracted. While texting. Wear a helmet and other protective equipment during sports activities. If you have firearms in your house, make sure you follow all gun safety procedures. Seek help if you have been physically or sexually abused. What's next? Go to your health care provider once a year for an annual wellness visit. Ask your health care provider how often you should have your eyes and teeth checked. Stay up to date on all vaccines. This information is not intended to replace advice given to you by your health care provider. Make sure you discuss any questions you have with your healthcare provider. Document Revised: 03/19/2020 Document Reviewed: 04/02/2018 [employer] Patient Education  2022 [employer]  

## 2021-03-14 NOTE — Assessment & Plan Note (Signed)
Tetanus due, provided today. Pap smear due, completed today.  Discussed the importance of a healthy diet and regular exercise in order for weight loss, and to reduce the risk of further co-morbidity.  Exam today stable.

## 2021-03-15 LAB — CYTOLOGY - PAP
Comment: NEGATIVE
Diagnosis: NEGATIVE
High risk HPV: NEGATIVE

## 2021-03-20 NOTE — Addendum Note (Signed)
Addended by: Donnamarie Poag on: 03/20/2021 03:50 PM   Modules accepted: Orders

## 2021-12-05 ENCOUNTER — Other Ambulatory Visit: Payer: Self-pay | Admitting: Primary Care

## 2021-12-25 DIAGNOSIS — F411 Generalized anxiety disorder: Secondary | ICD-10-CM | POA: Diagnosis not present

## 2021-12-25 DIAGNOSIS — F4323 Adjustment disorder with mixed anxiety and depressed mood: Secondary | ICD-10-CM | POA: Diagnosis not present

## 2022-02-26 DIAGNOSIS — J069 Acute upper respiratory infection, unspecified: Secondary | ICD-10-CM | POA: Diagnosis not present

## 2022-02-26 DIAGNOSIS — F4323 Adjustment disorder with mixed anxiety and depressed mood: Secondary | ICD-10-CM | POA: Diagnosis not present

## 2022-02-26 DIAGNOSIS — F411 Generalized anxiety disorder: Secondary | ICD-10-CM | POA: Diagnosis not present

## 2022-03-02 ENCOUNTER — Other Ambulatory Visit: Payer: Self-pay | Admitting: Primary Care

## 2022-03-02 DIAGNOSIS — N926 Irregular menstruation, unspecified: Secondary | ICD-10-CM

## 2022-03-12 DIAGNOSIS — J40 Bronchitis, not specified as acute or chronic: Secondary | ICD-10-CM | POA: Diagnosis not present

## 2022-03-12 DIAGNOSIS — Z6834 Body mass index (BMI) 34.0-34.9, adult: Secondary | ICD-10-CM | POA: Diagnosis not present

## 2022-03-27 DIAGNOSIS — Z113 Encounter for screening for infections with a predominantly sexual mode of transmission: Secondary | ICD-10-CM | POA: Diagnosis not present

## 2022-05-28 DIAGNOSIS — F4323 Adjustment disorder with mixed anxiety and depressed mood: Secondary | ICD-10-CM | POA: Diagnosis not present

## 2022-05-28 DIAGNOSIS — F411 Generalized anxiety disorder: Secondary | ICD-10-CM | POA: Diagnosis not present

## 2022-06-04 DIAGNOSIS — R928 Other abnormal and inconclusive findings on diagnostic imaging of breast: Secondary | ICD-10-CM | POA: Diagnosis not present

## 2022-06-04 DIAGNOSIS — N6311 Unspecified lump in the right breast, upper outer quadrant: Secondary | ICD-10-CM | POA: Diagnosis not present

## 2022-06-12 DIAGNOSIS — R928 Other abnormal and inconclusive findings on diagnostic imaging of breast: Secondary | ICD-10-CM | POA: Diagnosis not present

## 2022-06-12 DIAGNOSIS — N6311 Unspecified lump in the right breast, upper outer quadrant: Secondary | ICD-10-CM | POA: Diagnosis not present

## 2022-06-12 DIAGNOSIS — N641 Fat necrosis of breast: Secondary | ICD-10-CM | POA: Diagnosis not present

## 2022-06-12 DIAGNOSIS — N6459 Other signs and symptoms in breast: Secondary | ICD-10-CM | POA: Diagnosis not present

## 2022-06-19 DIAGNOSIS — N6311 Unspecified lump in the right breast, upper outer quadrant: Secondary | ICD-10-CM | POA: Diagnosis not present

## 2022-06-19 DIAGNOSIS — R928 Other abnormal and inconclusive findings on diagnostic imaging of breast: Secondary | ICD-10-CM | POA: Diagnosis not present

## 2022-08-21 DIAGNOSIS — F4323 Adjustment disorder with mixed anxiety and depressed mood: Secondary | ICD-10-CM | POA: Diagnosis not present

## 2022-08-21 DIAGNOSIS — F411 Generalized anxiety disorder: Secondary | ICD-10-CM | POA: Diagnosis not present

## 2022-10-21 DIAGNOSIS — F4323 Adjustment disorder with mixed anxiety and depressed mood: Secondary | ICD-10-CM | POA: Diagnosis not present

## 2022-10-21 DIAGNOSIS — F411 Generalized anxiety disorder: Secondary | ICD-10-CM | POA: Diagnosis not present

## 2023-01-21 DIAGNOSIS — F4323 Adjustment disorder with mixed anxiety and depressed mood: Secondary | ICD-10-CM | POA: Diagnosis not present

## 2023-01-21 DIAGNOSIS — F411 Generalized anxiety disorder: Secondary | ICD-10-CM | POA: Diagnosis not present

## 2023-04-15 DIAGNOSIS — F411 Generalized anxiety disorder: Secondary | ICD-10-CM | POA: Diagnosis not present

## 2023-04-15 DIAGNOSIS — F4323 Adjustment disorder with mixed anxiety and depressed mood: Secondary | ICD-10-CM | POA: Diagnosis not present

## 2023-04-23 DIAGNOSIS — R635 Abnormal weight gain: Secondary | ICD-10-CM | POA: Diagnosis not present

## 2023-06-17 DIAGNOSIS — F411 Generalized anxiety disorder: Secondary | ICD-10-CM | POA: Diagnosis not present

## 2023-06-24 DIAGNOSIS — F411 Generalized anxiety disorder: Secondary | ICD-10-CM | POA: Diagnosis not present

## 2023-07-15 DIAGNOSIS — F4323 Adjustment disorder with mixed anxiety and depressed mood: Secondary | ICD-10-CM | POA: Diagnosis not present

## 2023-07-15 DIAGNOSIS — F411 Generalized anxiety disorder: Secondary | ICD-10-CM | POA: Diagnosis not present

## 2023-07-17 DIAGNOSIS — F411 Generalized anxiety disorder: Secondary | ICD-10-CM | POA: Diagnosis not present

## 2023-07-23 DIAGNOSIS — F411 Generalized anxiety disorder: Secondary | ICD-10-CM | POA: Diagnosis not present
